# Patient Record
Sex: Male | Born: 1980 | Hispanic: No | Marital: Single | Smoking: Never smoker
Health system: Southern US, Community
[De-identification: ages and names within clinical notes are randomized; demographics above are authoritative.]

---

## 2008-10-12 ENCOUNTER — Emergency Department (HOSPITAL_COMMUNITY): Admission: EM | Admit: 2008-10-12 | Discharge: 2008-10-12 | Payer: Self-pay | Admitting: Emergency Medicine

## 2011-01-14 LAB — CBC
HCT: 47.9 % (ref 39.0–52.0)
Hemoglobin: 16.2 g/dL (ref 13.0–17.0)
MCHC: 33.9 g/dL (ref 30.0–36.0)
MCV: 92.7 fL (ref 78.0–100.0)
RBC: 5.16 MIL/uL (ref 4.22–5.81)
RDW: 13.4 % (ref 11.5–15.5)

## 2011-01-14 LAB — POCT I-STAT, CHEM 8
Calcium, Ion: 1.08 mmol/L — ABNORMAL LOW (ref 1.12–1.32)
Creatinine, Ser: 0.8 mg/dL (ref 0.4–1.5)
Glucose, Bld: 109 mg/dL — ABNORMAL HIGH (ref 70–99)
HCT: 51 % (ref 39.0–52.0)
Hemoglobin: 17.3 g/dL — ABNORMAL HIGH (ref 13.0–17.0)
Potassium: 3.3 mEq/L — ABNORMAL LOW (ref 3.5–5.1)
TCO2: 24 mmol/L (ref 0–100)

## 2011-01-14 LAB — DIFFERENTIAL
Basophils Absolute: 0.1 10*3/uL (ref 0.0–0.1)
Basophils Relative: 1 % (ref 0–1)
Eosinophils Absolute: 0 10*3/uL (ref 0.0–0.7)
Eosinophils Relative: 1 % (ref 0–5)
Monocytes Absolute: 0.8 10*3/uL (ref 0.1–1.0)
Monocytes Relative: 12 % (ref 3–12)
Neutro Abs: 4.5 10*3/uL (ref 1.7–7.7)

## 2011-01-14 LAB — POCT CARDIAC MARKERS: CKMB, poc: <1 ng/mL — ABNORMAL LOW (ref 1.0–8.0)

## 2016-03-02 ENCOUNTER — Emergency Department
Admission: EM | Admit: 2016-03-02 | Discharge: 2016-03-02 | Disposition: A | Discharge status: Home / Self Care | Payer: Self-pay | Attending: Emergency Medicine | Admitting: Emergency Medicine

## 2016-03-02 ENCOUNTER — Encounter: Payer: Self-pay | Admitting: Emergency Medicine

## 2016-03-02 DIAGNOSIS — K4090 Unilateral inguinal hernia, without obstruction or gangrene, not specified as recurrent: Secondary | ICD-10-CM | POA: Insufficient documentation

## 2016-03-02 DIAGNOSIS — F17210 Nicotine dependence, cigarettes, uncomplicated: Secondary | ICD-10-CM | POA: Insufficient documentation

## 2016-03-02 NOTE — ED Provider Notes (Addendum)
[facility] Emergency Department Provider Note  ____________________________________________   I have reviewed the triage vital signs and the nursing notes.   HISTORY  Chief Complaint Inguinal Hernia    HPI Robert Kaiser is a 35 y.o. male states she has had an inguinal hernia for 10 years. He states that he just can't take it anymore.  He denies any particular change in his chief complaint. This is a hasn't changed recently in any way that is significant. Doesn't become more painful has become more swollen has not had any significant change in color. Sometimes it abrades against his trousers and becomes a little reddened but that is normal and usually when he stops wearing pants for a little while it resolves. He has not had any trouble with his bowel movements he is not vomiting he has no abdominal pain. He states it is painful to carry hernia around that there is been no significant change in the pain of the hernia itself.      History reviewed. No pertinent past medical history.  There are no active problems to display for this patient.   History reviewed. No pertinent past surgical history.  Current Outpatient Rx  Name  Route  Sig  Dispense  Refill  . ranitidine (ZANTAC) 150 MG tablet   Oral   Take 150 mg by mouth 2 (two) times daily.           Allergies Review of patient's allergies indicates no known allergies.  No family history on file.  Social History Social History  Substance Use Topics  . Smoking status: Current Some Day Smoker    Types: Cigarettes  . Smokeless tobacco: None  . Alcohol Use: Yes     Comment: on the weekends    Review of Systems Constitutional: No fever/chills Eyes: No visual changes. ENT: No sore throat. No stiff neck no neck pain Cardiovascular: Denies chest pain. Respiratory: Denies shortness of breath. Gastrointestinal:   no vomiting.  No diarrhea.  No constipation. Genitourinary: Negative for  dysuria. Musculoskeletal: Negative lower extremity swelling Skin: Negative for rash. Neurological: Negative for headaches, focal weakness or numbness. 10-point ROS otherwise negative.  ____________________________________________   PHYSICAL EXAM:  VITAL SIGNS: ED Triage Vitals  Enc Vitals Group     BP 03/02/16 1803 140/91 mmHg     Pulse Rate 03/02/16 1803 94     Resp 03/02/16 1803 95     Temp 03/02/16 1803 99.1 F (37.3 C)     Temp Source 03/02/16 1803 Oral     SpO2 03/02/16 1803 95 %     Weight 03/02/16 1803 185 lb (83.915 kg)     Height 03/02/16 1803  (1.702 m)     Head Cir --      Peak Flow --      Pain Score 03/02/16 1806 10     Pain Loc --      Pain Edu? --      Excl. in GC? --     Constitutional: Alert and oriented. Well appearing and in no acute distress. Eyes: Conjunctivae are normal. PERRL. EOMI. Head: Atraumatic. Nose: No congestion/rhinnorhea. Mouth/Throat: Mucous membranes are moist.  Oropharynx non-erythematous. Neck: No stridor.   Nontender with no meningismus Cardiovascular: Normal rate, regular rhythm. Grossly normal heart sounds.  Good peripheral circulation. Respiratory: Normal respiratory effort.  No retractions. Lungs CTAB. Abdominal: Soft and nontender. No distention. No guarding no rebound Back:  There is no focal tenderness or step off there is no  midline tenderness there are no lesions noted. there is no CVA tenderness There is a very very large inguinal hernia with good bowel sounds noted soft, not reducible given its size and chronicity however it is not indurated or erythematous and only minimally tender Musculoskeletal: No lower extremity tenderness. No joint effusions, no DVT signs strong distal pulses no edema Neurologic:  Normal speech and language. No gross focal neurologic deficits are appreciated.  Skin:  Skin is warm, dry and intact. No rash noted. Psychiatric: Mood and affect are normal. Speech and behavior are  normal.  ____________________________________________   LABS (all labs ordered are listed, but only abnormal results are displayed)  Labs Reviewed - No data to display ____________________________________________  EKG  I personally interpreted any EKGs ordered by me or triage  ____________________________________________  RADIOLOGY  I reviewed any imaging ordered by me or triage that were performed during my shift and, if possible, patient and/or family made aware of any abnormal findings. ____________________________________________   PROCEDURES  Procedure(s) performed: None  Critical Care performed: None  ____________________________________________   INITIAL IMPRESSION / ASSESSMENT AND PLAN / ED COURSE  Pertinent labs & imaging results that were available during my care of the patient were reviewed by me and considered in my medical decision making (see chart for details).  Patient with a large inguinal hernia. It is too large to reduce and there is not much point in doing so given how long he has been out and how large it is. There is no evidence of any strangulated incarceration or ischemia. No signs are reassuring. It is not hot to touch, is not red. Old changes appear quite chronic. Extensive return precautions given and understood. The discussed with surgery, Dr. Everlene FarrierPabon, who asked that we refer the patient as an outpatient to them.  ----------------------------------------- 7:51 PM on 03/02/2016 -----------------------------------------  Serial exams show no evidence of acute strangulation, patient has been having normal bowel movements and is in no acute distress. He will follow closely with surgery as an outpatient. Extensive return precautions given and understood.   FINAL CLINICAL IMPRESSION(S) / ED DIAGNOSES  Final diagnoses:  None      This chart was dictated using voice recognition software.  Despite best efforts to proofread,  errors can occur which  can change meaning.     Jeanmarie PlantJames A McShane, MD 03/02/16 1923  Jeanmarie PlantJames A McShane, MD 03/02/16 [phone]

## 2016-03-02 NOTE — ED Notes (Signed)
Patient presents to the ED with issues with his hernia.  Patient states he has had it for several years.  Patient states his hernia is in his groin.  Patient states area is getting larger and becoming more painful over the past year.  Patient states pain is radiating into his lower back and causing difficulty walking and working.  Patient states area is reddish.

## 2016-03-02 NOTE — Discharge Instructions (Signed)

## 2016-03-08 ENCOUNTER — Encounter: Payer: Self-pay | Admitting: Emergency Medicine

## 2016-03-08 ENCOUNTER — Inpatient Hospital Stay
Admission: EM | Admit: 2016-03-08 | Discharge: 2016-03-11 | DRG: 352 | Disposition: A | Discharge status: Home / Self Care | Payer: Self-pay | Attending: Surgery | Admitting: Surgery

## 2016-03-08 ENCOUNTER — Emergency Department: Payer: Self-pay

## 2016-03-08 DIAGNOSIS — F17210 Nicotine dependence, cigarettes, uncomplicated: Secondary | ICD-10-CM | POA: Diagnosis present

## 2016-03-08 DIAGNOSIS — K219 Gastro-esophageal reflux disease without esophagitis: Secondary | ICD-10-CM | POA: Diagnosis present

## 2016-03-08 DIAGNOSIS — K4030 Unilateral inguinal hernia, with obstruction, without gangrene, not specified as recurrent: Principal | ICD-10-CM

## 2016-03-08 DIAGNOSIS — K4090 Unilateral inguinal hernia, without obstruction or gangrene, not specified as recurrent: Secondary | ICD-10-CM

## 2016-03-08 LAB — COMPREHENSIVE METABOLIC PANEL
ALT: 288 U/L — ABNORMAL HIGH (ref 17–63)
AST: 180 U/L — ABNORMAL HIGH (ref 15–41)
Albumin: 3.9 g/dL (ref 3.5–5.0)
Alkaline Phosphatase: 84 U/L (ref 38–126)
Anion gap: 12 (ref 5–15)
BILIRUBIN TOTAL: 0.7 mg/dL (ref 0.3–1.2)
BUN: 13 mg/dL (ref 6–20)
CHLORIDE: 105 mmol/L (ref 101–111)
CO2: 20 mmol/L — ABNORMAL LOW (ref 22–32)
CREATININE: 0.75 mg/dL (ref 0.61–1.24)
Calcium: 8.5 mg/dL — ABNORMAL LOW (ref 8.9–10.3)
Glucose, Bld: 148 mg/dL — ABNORMAL HIGH (ref 65–99)
POTASSIUM: 3.2 mmol/L — AB (ref 3.5–5.1)
Sodium: 137 mmol/L (ref 135–145)
TOTAL PROTEIN: 7.6 g/dL (ref 6.5–8.1)

## 2016-03-08 LAB — URINALYSIS COMPLETE WITH MICROSCOPIC (ARMC ONLY)
BILIRUBIN URINE: NEGATIVE
Bacteria, UA: NONE SEEN
Glucose, UA: NEGATIVE mg/dL
HGB URINE DIPSTICK: NEGATIVE
LEUKOCYTES UA: NEGATIVE
NITRITE: NEGATIVE
PH: 7 (ref 5.0–8.0)
PROTEIN: NEGATIVE mg/dL
RBC / HPF: NONE SEEN RBC/hpf (ref 0–5)
SPECIFIC GRAVITY, URINE: 1.019 (ref 1.005–1.030)
SQUAMOUS EPITHELIAL / LPF: NONE SEEN
WBC, UA: NONE SEEN WBC/hpf (ref 0–5)

## 2016-03-08 LAB — CBC WITH DIFFERENTIAL/PLATELET
BASOS ABS: 0 10*3/uL (ref 0–0.1)
BASOS PCT: 1 %
EOS PCT: 2 %
Eosinophils Absolute: 0.1 10*3/uL (ref 0–0.7)
HCT: 44.6 % (ref 40.0–52.0)
Hemoglobin: 15.4 g/dL (ref 13.0–18.0)
LYMPHS PCT: 21 %
Lymphs Abs: 1.6 10*3/uL (ref 1.0–3.6)
MCH: 31.6 pg (ref 26.0–34.0)
MCHC: 34.5 g/dL (ref 32.0–36.0)
MCV: 91.6 fL (ref 80.0–100.0)
MONO ABS: 0.8 10*3/uL (ref 0.2–1.0)
Monocytes Relative: 11 %
NEUTROS ABS: 4.9 10*3/uL (ref 1.4–6.5)
Neutrophils Relative %: 65 %
PLATELETS: 147 10*3/uL — AB (ref 150–440)
RBC: 4.87 MIL/uL (ref 4.40–5.90)
RDW: 13.9 % (ref 11.5–14.5)
WBC: 7.4 10*3/uL (ref 3.8–10.6)

## 2016-03-08 LAB — LIPASE, BLOOD: LIPASE: 29 U/L (ref 11–51)

## 2016-03-08 MED ORDER — CEFAZOLIN SODIUM 1-5 GM-% IV SOLN
1.0000 g | INTRAVENOUS | Status: AC
  Administered 2016-03-09: 1 g via INTRAVENOUS
  Filled 2016-03-08: qty 50

## 2016-03-08 MED ORDER — HYDROCODONE-ACETAMINOPHEN 5-325 MG PO TABS
1.0000 | ORAL_TABLET | ORAL | Status: DC | PRN
  Administered 2016-03-09 – 2016-03-11 (×4): 1 via ORAL
  Filled 2016-03-08 (×4): qty 1

## 2016-03-08 MED ORDER — PANTOPRAZOLE SODIUM 40 MG PO TBEC
40.0000 mg | DELAYED_RELEASE_TABLET | Freq: Every day | ORAL | Status: DC
  Administered 2016-03-10 – 2016-03-11 (×2): 40 mg via ORAL
  Filled 2016-03-08 (×2): qty 1

## 2016-03-08 MED ORDER — ALVIMOPAN 12 MG PO CAPS: 12.0000 mg | ORAL_CAPSULE | Freq: Once | ORAL | Status: DC

## 2016-03-08 MED ORDER — ONDANSETRON HCL 4 MG/2ML IJ SOLN: 4.0000 mg | Freq: Four times a day (QID) | INTRAMUSCULAR | Status: DC | PRN

## 2016-03-08 MED ORDER — ACETAMINOPHEN 325 MG PO TABS: 650.0000 mg | ORAL_TABLET | Freq: Four times a day (QID) | ORAL | Status: DC | PRN

## 2016-03-08 MED ORDER — ENOXAPARIN SODIUM 40 MG/0.4ML ~~LOC~~ SOLN
40.0000 mg | SUBCUTANEOUS | Status: DC
  Administered 2016-03-09 – 2016-03-10 (×2): 40 mg via SUBCUTANEOUS
  Filled 2016-03-08 (×2): qty 0.4

## 2016-03-08 MED ORDER — KCL IN DEXTROSE-NACL 20-5-0.45 MEQ/L-%-% IV SOLN
INTRAVENOUS | Status: DC
  Administered 2016-03-08 – 2016-03-10 (×3): via INTRAVENOUS
  Filled 2016-03-08 (×5): qty 1000

## 2016-03-08 MED ORDER — MORPHINE SULFATE (PF) 4 MG/ML IV SOLN
4.0000 mg | Freq: Once | INTRAVENOUS | Status: AC
  Administered 2016-03-08: 4 mg via INTRAVENOUS
  Filled 2016-03-08: qty 1

## 2016-03-08 MED ORDER — HYDROMORPHONE HCL 1 MG/ML IJ SOLN: 1.0000 mg | INTRAMUSCULAR | Status: DC | PRN

## 2016-03-08 MED ORDER — DIATRIZOATE MEGLUMINE & SODIUM 66-10 % PO SOLN
15.0000 mL | Freq: Once | ORAL | Status: AC
  Administered 2016-03-08: 15 mL via ORAL

## 2016-03-08 MED ORDER — SODIUM CHLORIDE 0.9 % IV BOLUS (SEPSIS)
1000.0000 mL | Freq: Once | INTRAVENOUS | Status: AC
  Administered 2016-03-08: 1000 mL via INTRAVENOUS

## 2016-03-08 MED ORDER — ACETAMINOPHEN 650 MG RE SUPP: 650.0000 mg | Freq: Four times a day (QID) | RECTAL | Status: DC | PRN

## 2016-03-08 MED ORDER — IOPAMIDOL (ISOVUE-300) INJECTION 61%
100.0000 mL | Freq: Once | INTRAVENOUS | Status: AC | PRN
  Administered 2016-03-08: 100 mL via INTRAVENOUS

## 2016-03-08 MED ORDER — CEFAZOLIN (ANCEF) 1 G IV SOLR: 1.0000 g | INTRAVENOUS | Status: DC

## 2016-03-08 MED ORDER — ONDANSETRON 8 MG PO TBDP: 4.0000 mg | ORAL_TABLET | Freq: Four times a day (QID) | ORAL | Status: DC | PRN

## 2016-03-08 NOTE — ED Notes (Signed)
Patient was seen in the ED recently for same complaint.  Returns today with c/o pain to testicular and thorax pain.  Patient states he has an issue with a testicular hernia and states that pain has worsened and has difficulty walking.  Patient states he has a follow up appointment scheduled for Wednesday.

## 2016-03-08 NOTE — H&P (Signed)
Robert Kaiser is a 35 y.o. male  with a long-standing increasingly symptomatic left inguinal hernia.  HPI: He was seen a week ago with symptomatic left inguinal hernia which was incarcerated. Workup revealed a large portion of his colon in the left scrotum. He did not have any nausea or vomiting at that time and relates normal bowel function. He did present for further evaluation and was referred to the surgical outpatient clinic. However he could not make an appointment in a timely fashion and continues to have increasing symptoms. His pain has worsened over the last 36 hours. He presents back to the emergency room for further evaluation.  He denies any other significant abdominal problem. He has no previous abdominal surgery. Specifically denies any history of hepatitis, yellow jaundice, pancreatitis, peptic ulcer disease, gallbladder disease, or diverticulitis. He does have a history of esophageal reflux.  History reviewed. No pertinent past medical history. History reviewed. No pertinent past surgical history. Social History   Social History  . Marital Status: Single    Spouse Name: N/A  . Number of Children: N/A  . Years of Education: N/A   Social History Main Topics  . Smoking status: Current Some Day Smoker    Types: Cigarettes  . Smokeless tobacco: None  . Alcohol Use: Yes     Comment: on the weekends  . Drug Use: None  . Sexual Activity: Not Asked   Other Topics Concern  . None   Social History Narrative     Review of Systems  Constitutional: Negative for fever and chills.  HENT: Negative.   Eyes: Negative.  Negative for blurred vision.  Respiratory: Negative for cough, shortness of breath and wheezing.   Cardiovascular: Negative for chest pain and palpitations.  Gastrointestinal: Positive for heartburn and nausea. Negative for vomiting and abdominal pain.  Genitourinary: Negative.   Musculoskeletal: Negative.   Skin: Negative.   Neurological: Negative.  Negative for  headaches.  Psychiatric/Behavioral: Negative.      PHYSICAL EXAM: BP 120/70 mmHg  Pulse 106  Temp(Src) 97.7 F (36.5 C) (Oral)  Resp 18  Ht 5\' 7"  (1.702 m)  Wt 86.183 kg (190 lb)  BMI 29.75 kg/m2  SpO2 90%  Physical Exam  Constitutional: He is oriented to person, place, and time. He appears well-developed and well-nourished. He appears distressed.  HENT:  Head: Normocephalic and atraumatic.  Eyes: EOM are normal. Pupils are equal, round, and reactive to light.  Neck: Normal range of motion. Neck supple.  Cardiovascular: Normal rate, regular rhythm and normal heart sounds.   Pulmonary/Chest: Effort normal and breath sounds normal. No respiratory distress. He has no wheezes.  Abdominal: Soft. Bowel sounds are normal. He exhibits distension. There is no tenderness.  Musculoskeletal: Normal range of motion. He exhibits no edema or tenderness.  Neurological: He is alert and oriented to person, place, and time.  Skin: Skin is warm and dry.  Psychiatric: He has a normal mood and affect. His behavior is normal.   Abdomen is soft without any significant tenderness. He is mildly distended. He has a massive left inguinal hernia which is not reducible. The scrotum is essentially the size of a basketball. There is no evidence of any edema or discoloration.  Impression/Plan: We discussed the options available to him in detail. Because of his increasing symptoms we will planned omitting observation and elected to perform surgery later today or tomorrow morning. Risks benefits and options have been outlined and accepted. He is in agreement.I have explained the procedure, risks,  and aftercare of inguinal hernia repair to [employer].   Risks include but are not limited to bleeding, infection, wound problems, anesthesia, recurrence, bladder or intestine injury, urinary retention, testicular dysfunction, chronic pain, mesh problems.  He  seems to understand and agrees to proceed.  Questions were  answered to his stated satisfaction.   Robert Rouge III, MD  03/08/2016, 12:15 PM

## 2016-03-08 NOTE — ED Provider Notes (Addendum)
----------------------------------------- 10:20 AM on 03/08/2016 -----------------------------------------  [facility] Emergency Department Provider Note  ____________________________________________   I have reviewed the triage vital signs and the nursing notes.   HISTORY  Chief Complaint No chief complaint on file.    HPI Robert Kaiser is a 35 y.o. male with a chronic scrotal hernia seen here 4 days ago for similar. Was to follow-up with surgery. Patient has been unable to get in with surgery, and he states that his pain is getting severe. He denies any fever or chills. Denies nausea or vomiting. Did have a normal bowel movement yesterday. States that he cannot walk or function because he isn't too much pain from his hernia. He was started on pain medication when he came in.He states the pain goes "everywhere" but mostly it's in his scrotal region. He is unable to ambulate or work. He does have an appointment for next Wednesday to see surgery but cannot make it that long because of the increasing pain    History reviewed. No pertinent past medical history.  There are no active problems to display for this patient.   History reviewed. No pertinent past surgical history.  Current Outpatient Rx  Name  Route  Sig  Dispense  Refill  . ranitidine (ZANTAC) 150 MG tablet   Oral   Take 150 mg by mouth 2 (two) times daily.           Allergies Review of patient's allergies indicates no known allergies.  No family history on file.  Social History Social History  Substance Use Topics  . Smoking status: Current Some Day Smoker    Types: Cigarettes  . Smokeless tobacco: None  . Alcohol Use: Yes     Comment: on the weekends    Review of Systems Constitutional: No fever/chills Eyes: No visual changes. ENT: No sore throat. No stiff neck no neck pain Cardiovascular: Denies chest pain. Respiratory: Denies shortness of breath. Gastrointestinal:   no  vomiting.  No diarrhea.  No constipation. Genitourinary: Negative for dysuria. Musculoskeletal: Negative lower extremity swelling Skin: Negative for rash. Neurological: Negative for headaches, focal weakness or numbness. 10-point ROS otherwise negative.  ____________________________________________   PHYSICAL EXAM:  VITAL SIGNS: ED Triage Vitals  Enc Vitals Group     BP 03/08/16 0803 129/96 mmHg     Pulse Rate 03/08/16 0803 95     Resp 03/08/16 0803 18     Temp 03/08/16 0803 97.7 F (36.5 C)     Temp Source 03/08/16 0803 Oral     SpO2 03/08/16 0803 94 %     Weight 03/08/16 0810 190 lb (86.183 kg)     Height 03/08/16 0810 5\' 7"  (1.702 m)     Head Cir --      Peak Flow --      Pain Score 03/08/16 0808 10     Pain Loc --      Pain Edu? --      Excl. in GC? --     Constitutional: Alert and oriented. Well appearing and in no acute distress.Anxious and upset Eyes: Conjunctivae are normal. PERRL. EOMI. Head: Atraumatic. Nose: No congestion/rhinnorhea. Mouth/Throat: Mucous membranes are moist.  Oropharynx non-erythematous. Neck: No stridor.   Nontender with no meningismus Cardiovascular: Normal rate, regular rhythm. Grossly normal heart sounds.  Good peripheral circulation. Respiratory: Normal respiratory effort.  No retractions. Lungs CTAB. Abdominal: Soft and nontender. No distention. No guarding no rebound Back:  There is no focal tenderness or step off there  is no midline tenderness there are no lesions noted. there is no CVA tenderness Again noted is a very large scrotal hernia with good bowel sounds no evidence of Fournier's or cellulitis, and tender but not acutely so. Musculoskeletal: No lower extremity tenderness. No joint effusions, no DVT signs strong distal pulses no edema Neurologic:  Normal speech and language. No gross focal neurologic deficits are appreciated.  Skin:  Skin is warm, dry and intact. No rash noted. Psychiatric: Mood and affect are normal. Speech and  behavior are normal.  ____________________________________________   LABS (all labs ordered are listed, but only abnormal results are displayed)  Labs Reviewed  COMPREHENSIVE METABOLIC PANEL - Abnormal; Notable for the following:    Potassium 3.2 (*)    CO2 20 (*)    Glucose, Bld 148 (*)    Calcium 8.5 (*)    AST 180 (*)    ALT 288 (*)    All other components within normal limits  CBC WITH DIFFERENTIAL/PLATELET - Abnormal; Notable for the following:    Platelets 147 (*)    All other components within normal limits  URINALYSIS COMPLETEWITH MICROSCOPIC ([facility] ONLY) - Abnormal; Notable for the following:    Color, Urine STRAW (*)    APPearance CLEAR (*)    Ketones, ur TRACE (*)    All other components within normal limits  LIPASE, BLOOD   ____________________________________________  EKG  I personally interpreted any EKGs ordered by me or triage  ____________________________________________  RADIOLOGY  I reviewed any imaging ordered by me or triage that were performed during my shift and, if possible, patient and/or family made aware of any abnormal findings. ____________________________________________   PROCEDURES  Procedure(s) performed: None  Critical Care performed: None  ____________________________________________   INITIAL IMPRESSION / ASSESSMENT AND PLAN / ED COURSE  Pertinent labs & imaging results that were available during my care of the patient were reviewed by me and considered in my medical decision making (see chart for details).  Patient with no evidence of strangulation, he does have a very significant inguinal herniation. Blood work is reassuring I did do a CT scan this time to ensure that there was no other pathology, and it shows that his entire colon and part of his terminal ileum is in his scrotal sac. No evidence of other inflammatory changes. I have paged surgery, given the patient is failing outpatient management and we will see what their  input will be.  ----------------------------------------- 10:55 AM on 03/08/2016 -----------------------------------------  Dr. Michela Pitcher has graciously taken the consult and agrees to come see the patient assumes he is done with surgery. Patient resting comfortably at this time with ongoing but diminished discomfort. FINAL CLINICAL IMPRESSION(S) / ED DIAGNOSES  Final diagnoses:  None      This chart was dictated using voice recognition software.  Despite best efforts to proofread,  errors can occur which can change meaning.     Jeanmarie Plant, MD 03/08/16 1023  Jeanmarie Plant, MD 03/08/16 1056

## 2016-03-08 NOTE — ED Notes (Signed)
Patient states that he is having pain in his lower back, lower abdomen, and the pain radiates down into his testicles. Patient states that the pain is so that is so bad that he can hardly walk.

## 2016-03-08 NOTE — ED Notes (Signed)
Per surgeon all orders released so that bed assignment people could assign bed

## 2016-03-09 ENCOUNTER — Encounter
Admission: EM | Disposition: A | Discharge status: Home / Self Care | Payer: Self-pay | Source: Home / Self Care | Attending: Surgery

## 2016-03-09 ENCOUNTER — Observation Stay: Payer: Self-pay | Admitting: Certified Registered Nurse Anesthetist

## 2016-03-09 ENCOUNTER — Encounter: Payer: Self-pay | Admitting: Anesthesiology

## 2016-03-09 HISTORY — PX: OTHER SURGICAL HISTORY: SHX169

## 2016-03-09 HISTORY — PX: INGUINAL HERNIA REPAIR: SHX194

## 2016-03-09 LAB — BASIC METABOLIC PANEL
Anion gap: 5 (ref 5–15)
BUN: 12 mg/dL (ref 6–20)
CHLORIDE: 102 mmol/L (ref 101–111)
CO2: 27 mmol/L (ref 22–32)
CREATININE: 0.86 mg/dL (ref 0.61–1.24)
Calcium: 8.4 mg/dL — ABNORMAL LOW (ref 8.9–10.3)
GFR calc non Af Amer: >60 mL/min (ref >60)
GLUCOSE: 100 mg/dL — AB (ref 65–99)
Potassium: 4 mmol/L (ref 3.5–5.1)
Sodium: 134 mmol/L — ABNORMAL LOW (ref 135–145)

## 2016-03-09 LAB — CBC
HCT: 47.8 % (ref 40.0–52.0)
HEMOGLOBIN: 16.3 g/dL (ref 13.0–18.0)
MCH: 32.2 pg (ref 26.0–34.0)
MCHC: 34.1 g/dL (ref 32.0–36.0)
MCV: 94.2 fL (ref 80.0–100.0)
PLATELETS: 136 10*3/uL — AB (ref 150–440)
RBC: 5.08 MIL/uL (ref 4.40–5.90)
RDW: 14.1 % (ref 11.5–14.5)
WBC: 6.1 10*3/uL (ref 3.8–10.6)

## 2016-03-09 LAB — SURGICAL PCR SCREEN
MRSA, PCR: NEGATIVE
Staphylococcus aureus: NEGATIVE

## 2016-03-09 SURGERY — REPAIR, HERNIA, INGUINAL, ADULT
Anesthesia: General | Laterality: Left

## 2016-03-09 MED ORDER — ACETAMINOPHEN 650 MG RE SUPP: 650.0000 mg | Freq: Four times a day (QID) | RECTAL | Status: DC | PRN

## 2016-03-09 MED ORDER — FENTANYL CITRATE (PF) 100 MCG/2ML IJ SOLN
INTRAMUSCULAR | Status: AC
  Filled 2016-03-09: qty 2

## 2016-03-09 MED ORDER — PROPOFOL 10 MG/ML IV BOLUS
INTRAVENOUS | Status: DC | PRN
  Administered 2016-03-09: 200 mg via INTRAVENOUS

## 2016-03-09 MED ORDER — MIDAZOLAM HCL 2 MG/2ML IJ SOLN
INTRAMUSCULAR | Status: DC | PRN
  Administered 2016-03-09: 2 mg via INTRAVENOUS
  Administered 2016-03-09: 3 mg via INTRAVENOUS

## 2016-03-09 MED ORDER — ONDANSETRON HCL 4 MG/2ML IJ SOLN: 4.0000 mg | Freq: Once | INTRAMUSCULAR | Status: DC | PRN

## 2016-03-09 MED ORDER — ACETAMINOPHEN 325 MG PO TABS: 650.0000 mg | ORAL_TABLET | Freq: Four times a day (QID) | ORAL | Status: DC | PRN

## 2016-03-09 MED ORDER — LACTATED RINGERS IV SOLN
INTRAVENOUS | Status: DC | PRN
  Administered 2016-03-09 (×2): via INTRAVENOUS

## 2016-03-09 MED ORDER — ONDANSETRON HCL 4 MG/2ML IJ SOLN
INTRAMUSCULAR | Status: DC | PRN
  Administered 2016-03-09: 4 mg via INTRAVENOUS

## 2016-03-09 MED ORDER — ROCURONIUM BROMIDE 100 MG/10ML IV SOLN
INTRAVENOUS | Status: DC | PRN
  Administered 2016-03-09: 5 mg via INTRAVENOUS

## 2016-03-09 MED ORDER — FENTANYL CITRATE (PF) 100 MCG/2ML IJ SOLN
25.0000 ug | INTRAMUSCULAR | Status: DC | PRN
  Administered 2016-03-09 (×4): 25 ug via INTRAVENOUS

## 2016-03-09 MED ORDER — FENTANYL CITRATE (PF) 100 MCG/2ML IJ SOLN
INTRAMUSCULAR | Status: DC | PRN
  Administered 2016-03-09: 150 ug via INTRAVENOUS
  Administered 2016-03-09: 100 ug via INTRAVENOUS
  Administered 2016-03-09: 50 ug via INTRAVENOUS

## 2016-03-09 MED ORDER — NEOMYCIN-POLYMYXIN B GU 40-200000 IR SOLN
Status: AC
  Filled 2016-03-09: qty 2

## 2016-03-09 MED ORDER — LIDOCAINE HCL (CARDIAC) 20 MG/ML IV SOLN
INTRAVENOUS | Status: DC | PRN
  Administered 2016-03-09: 100 mg via INTRAVENOUS

## 2016-03-09 MED ORDER — DEXAMETHASONE SODIUM PHOSPHATE 10 MG/ML IJ SOLN
INTRAMUSCULAR | Status: DC | PRN
  Administered 2016-03-09: 8 mg via INTRAVENOUS

## 2016-03-09 MED ORDER — SUCCINYLCHOLINE CHLORIDE 20 MG/ML IJ SOLN
INTRAMUSCULAR | Status: DC | PRN
  Administered 2016-03-09: 100 mg via INTRAVENOUS

## 2016-03-09 MED ORDER — BUPIVACAINE HCL (PF) 0.25 % IJ SOLN
INTRAMUSCULAR | Status: AC
  Filled 2016-03-09: qty 30

## 2016-03-09 SURGICAL SUPPLY — 44 items
BULB RESERV EVAC DRAIN JP 100C (MISCELLANEOUS) ×4 IMPLANT
CANISTER SUCT 1200ML W/VALVE (MISCELLANEOUS) ×2 IMPLANT
CHLORAPREP W/TINT 26ML (MISCELLANEOUS) ×2 IMPLANT
DRAIN CHANNEL JP 19F (MISCELLANEOUS) ×4 IMPLANT
DRAIN PENROSE 5/8X18 LTX STRL (WOUND CARE) ×2 IMPLANT
DRAPE INCISE IOBAN 66X45 STRL (DRAPES) ×2 IMPLANT
DRAPE LAPAROTOMY 100X77 ABD (DRAPES) ×2 IMPLANT
DRAPE SHEET LG 3/4 BI-LAMINATE (DRAPES) ×2 IMPLANT
DRAPE UTILITY 15X26 TOWEL STRL (DRAPES) IMPLANT
DRSG OPSITE POSTOP 4X12 (GAUZE/BANDAGES/DRESSINGS) ×2 IMPLANT
ELECT CAUTERY BLADE 6.4 (BLADE) ×2 IMPLANT
ELECT REM PT RETURN 9FT ADLT (ELECTROSURGICAL) ×2
ELECTRODE REM PT RTRN 9FT ADLT (ELECTROSURGICAL) ×1 IMPLANT
GAUZE SPONGE 4X4 12PLY STRL (GAUZE/BANDAGES/DRESSINGS) ×2 IMPLANT
GLOVE BIO SURGEON STRL SZ7.5 (GLOVE) ×8 IMPLANT
GLOVE INDICATOR 8.0 STRL GRN (GLOVE) ×2 IMPLANT
GOWN STRL REUS W/ TWL LRG LVL3 (GOWN DISPOSABLE) ×2 IMPLANT
GOWN STRL REUS W/TWL LRG LVL3 (GOWN DISPOSABLE) ×2
KIT RM TURNOVER STRD PROC AR (KITS) ×2 IMPLANT
LABEL OR SOLS (LABEL) ×2 IMPLANT
LIGASURE BLUNT 5MM 37CM (INSTRUMENTS) ×2 IMPLANT
LIQUID BAND (GAUZE/BANDAGES/DRESSINGS) ×2 IMPLANT
MESH HERNIA 1.6X1.9 PLUG LRG (Mesh General) ×1 IMPLANT
MESH HERNIA PLUG LRG (Mesh General) ×1 IMPLANT
NDL SAFETY 25GX1.5 (NEEDLE) ×2 IMPLANT
NEEDLE FILTER BLUNT 18X 1/2SAF (NEEDLE) ×1
NEEDLE FILTER BLUNT 18X1 1/2 (NEEDLE) ×1 IMPLANT
NS IRRIG 500ML POUR BTL (IV SOLUTION) ×2 IMPLANT
PACK BASIN MINOR ARMC (MISCELLANEOUS) ×2 IMPLANT
SPONGE LAP 18X18 5 PK (GAUZE/BANDAGES/DRESSINGS) ×4 IMPLANT
STAPLER SKIN PROX 35W (STAPLE) ×2 IMPLANT
STRIP CLOSURE SKIN 1/2X4 (GAUZE/BANDAGES/DRESSINGS) ×2 IMPLANT
SUT ETHILON 3-0 FS-10 30 BLK (SUTURE) ×4
SUT MAXON ABS #0 GS21 30IN (SUTURE) ×2 IMPLANT
SUT SURGILON 0 30 BLK (SUTURE) ×2 IMPLANT
SUT SURGILON 0 BLK (SUTURE) ×2 IMPLANT
SUT VIC AB 0 CT1 36 (SUTURE) ×2 IMPLANT
SUT VIC AB 0 UR5 27 (SUTURE) ×2 IMPLANT
SUT VIC AB 3-0 SH 27 (SUTURE) ×1
SUT VIC AB 3-0 SH 27X BRD (SUTURE) ×1 IMPLANT
SUTURE EHLN 3-0 FS-10 30 BLK (SUTURE) ×2 IMPLANT
SWABSTK COMLB BENZOIN TINCTURE (MISCELLANEOUS) ×2 IMPLANT
SYR 3ML LL SCALE MARK (SYRINGE) IMPLANT
SYRINGE 10CC LL (SYRINGE) ×2 IMPLANT

## 2016-03-09 NOTE — Op Note (Signed)
03/08/2016 - 03/09/2016  1:09 PM  PATIENT:  Robert Kaiser  35 y.o. male  PRE-OPERATIVE DIAGNOSIS:  incarcerated left inguinal hernia  POST-OPERATIVE DIAGNOSIS:  same  PROCEDURE:  Procedure(s): HERNIA REPAIR INGUINAL ADULT (Left)  SURGEON:  Surgeon(s) and Role:    * Tiney Rougealph Ely III, MD - Primary   ASSISTANTS: none   ANESTHESIA:   none  EBL:  Total I/O In: 1905 [I.V.:1905] Out: 250 [Urine:250]   DRAINS: (2) Jackson-Pratt drain(s) with closed bulb suction in the scrotum   LOCAL MEDICATIONS USED:  MARCAINE      DISPOSITION OF SPECIMEN:  PATHOLOGY   DICTATION: .Dragon Dictation with the patient in the supine position after induction appropriate general anesthesia the patient's abdomen and scrotum were prepped with ChloraPrep and draped sterile towels. Left lower quadrant incision was made from the suprapubic area to the iliac crest on the left side and carried down to the septae stitch Bovie cautery. The external oblique fascia was identified and opened through the external ring. The ring was massive with the large hernia sac. I could not reduce hernia sac out of the scrotum with the bowel in place. Preoperative CT demonstrated the entire colon and part of the terminal ileum was in the hernia sac. I opened the hernia sac proximally dividing the portion of it and then was able to lift the bowel into the incision.  I extended the internal ring defect and effort to try to reduce all of the bowel omentum after much manipulation could not reduce the bowel and the omentum. That reason I used the LigaSure apparatus to remove a large portion of the transverse colon omentum sending it for pathology. With that maneuver I was able to reduce the colon and terminal ileum and the abdominal cavity.  Tedious dissection was then required to remove the massive sac from the scrotum separating it from the cord structures and testicle. Sac was amputated and oversewn at the internal ring using 0 Vicryl. Lap pad  was introduced into the abdominal cavity to hold the sac and placed on the remainder of the procedure was contemplated.  I elected to place a plug and patch system with a plug in the main internal ring defect sutured in place to the conjoined tendon and to the suprapubic tubercle using the 0 Surgilon suture. On top of that I then placed the precut Prolene mesh into the defect suturing it to conjoined tendon and Poupart's ligament using 0 Surgilon. I overlapped the cut ends on the pubic tubercle. I then closed some of the redundant muscle soft tissue over the mesh using interrupted figure-of-eight sutures of 0 Maxon. The sponge was removed prior to closing the lateral aspect. The testicle was then sutured into the scrotum to hold it in place and avoid torsion using 0 Vicryl tied on the outside of the scrotum. 219 French drains were then placed one through the scrotum once the lateral abdomen suture placed with 3-0 nylon suture. Hemostasis appeared satisfactory. The skin was then clipped sterile dressing applied. The patient was awakened and returned recovery room having tolerated procedure well. Sponge instrument and needle count were correct 2 in the operating room.  PLAN OF CARE: Admit to inpatient   PATIENT DISPOSITION:  PACU - hemodynamically stable.   Tiney Rougealph Ely III, MD

## 2016-03-09 NOTE — Anesthesia Preprocedure Evaluation (Signed)
Anesthesia Evaluation  Patient identified by MRN, date of birth, ID band Patient awake    Reviewed: Allergy & Precautions, H&P , NPO status , Patient's Chart, lab work & pertinent test results, reviewed documented beta blocker date and time   Airway Mallampati: IV  TM Distance: >3 FB Neck ROM: full    Dental no notable dental hx. (+) Missing, Teeth Intact   Pulmonary neg shortness of breath, neg sleep apnea, neg COPD, neg recent URI, Current Smoker,    Pulmonary exam normal breath sounds clear to auscultation       Cardiovascular Exercise Tolerance: Good negative cardio ROS Normal cardiovascular exam Rhythm:regular Rate:Normal     Neuro/Psych negative neurological ROS  negative psych ROS   GI/Hepatic Neg liver ROS, GERD  ,  Endo/Other  negative endocrine ROS  Renal/GU negative Renal ROS  negative genitourinary   Musculoskeletal   Abdominal   Peds  Hematology negative hematology ROS (+)   Anesthesia Other Findings History reviewed. No pertinent past medical history.   Reproductive/Obstetrics negative OB ROS                             Anesthesia Physical Anesthesia Plan  ASA: II  Anesthesia Plan: General   Post-op Pain Management:    Induction:   Airway Management Planned:   Additional Equipment:   Intra-op Plan:   Post-operative Plan:   Informed Consent: I have reviewed the patients History and Physical, chart, labs and discussed the procedure including the risks, benefits and alternatives for the proposed anesthesia with the patient or authorized representative who has indicated his/her understanding and acceptance.   Dental Advisory Given  Plan Discussed with: Anesthesiologist, CRNA and Surgeon  Anesthesia Plan Comments:         Anesthesia Quick Evaluation

## 2016-03-09 NOTE — Anesthesia Postprocedure Evaluation (Signed)
Anesthesia Post Note  Patient: Robert Kaiser  Procedure(s) Performed: Procedure(s) (LRB): HERNIA REPAIR INGUINAL ADULT (Left)  Patient location during evaluation: PACU Anesthesia Type: General Level of consciousness: awake and alert Pain management: pain level controlled Vital Signs Assessment: post-procedure vital signs reviewed and stable Respiratory status: spontaneous breathing, nonlabored ventilation, respiratory function stable and patient connected to nasal cannula oxygen Cardiovascular status: blood pressure returned to baseline and stable Postop Assessment: no signs of nausea or vomiting Anesthetic complications: no    Last Vitals:  Filed Vitals:   03/09/16 1407 03/09/16 1420  BP:  122/85  Pulse: 80 80  Temp:  36.7 C  Resp: 15 20    Last Pain:  Filed Vitals:   03/09/16 1529  PainSc: 3                  Lenard SimmerAndrew Latrica Clowers

## 2016-03-09 NOTE — Progress Notes (Signed)

## 2016-03-09 NOTE — Transfer of Care (Signed)
Immediate Anesthesia Transfer of Care Note  Patient: Robert Kaiser  Procedure(s) Performed: Procedure(s): HERNIA REPAIR INGUINAL ADULT (Left)  Patient Location: PACU  Anesthesia Type:General  Level of Consciousness: sedated  Airway & Oxygen Therapy: Patient Spontanous Breathing and Patient connected to face mask oxygen  Post-op Assessment: Report given to RN and Post -op Vital signs reviewed and stable  Post vital signs: Reviewed and stable  Last Vitals:  Filed Vitals:   03/09/16 0058 03/09/16 0505  BP: 122/75 110/69  Pulse: 62 67  Temp: 36.6 C 36.8 C  Resp: 18 20    Last Pain:  Filed Vitals:   03/09/16 0505  PainSc: 6          Complications: No apparent anesthesia complications

## 2016-03-09 NOTE — Anesthesia Procedure Notes (Signed)
Procedure Name: Intubation Date/Time: 03/09/2016 10:44 AM Performed by: Clovis FredricksonRISSON, Johney Perotti Pre-anesthesia Checklist: Patient identified, Emergency Drugs available, Suction available, Patient being monitored and Timeout performed Patient Re-evaluated:Patient Re-evaluated prior to inductionOxygen Delivery Method: Circle system utilized Preoxygenation: Pre-oxygenation with 100% oxygen Intubation Type: IV induction Ventilation: Mask ventilation without difficulty Laryngoscope Size: Mac and 4 Grade View: Grade I Tube type: Oral Tube size: 7.5 mm Number of attempts: 1 Airway Equipment and Method: Stylet Placement Confirmation: ETT inserted through vocal cords under direct vision,  positive ETCO2,  CO2 detector and breath sounds checked- equal and bilateral Secured at: 23 cm Tube secured with: Tape Dental Injury: Teeth and Oropharynx as per pre-operative assessment

## 2016-03-10 LAB — BASIC METABOLIC PANEL
ANION GAP: 7 (ref 5–15)
BUN: 10 mg/dL (ref 6–20)
CALCIUM: 8.3 mg/dL — AB (ref 8.9–10.3)
CO2: 23 mmol/L (ref 22–32)
Chloride: 103 mmol/L (ref 101–111)
Creatinine, Ser: 0.63 mg/dL (ref 0.61–1.24)
GLUCOSE: 122 mg/dL — AB (ref 65–99)
Potassium: 3.9 mmol/L (ref 3.5–5.1)
SODIUM: 133 mmol/L — AB (ref 135–145)

## 2016-03-10 LAB — CBC
HCT: 44.5 % (ref 40.0–52.0)
HEMOGLOBIN: 15.6 g/dL (ref 13.0–18.0)
MCH: 31.9 pg (ref 26.0–34.0)
MCHC: 35 g/dL (ref 32.0–36.0)
MCV: 91 fL (ref 80.0–100.0)
Platelets: 163 10*3/uL (ref 150–440)
RBC: 4.89 MIL/uL (ref 4.40–5.90)
RDW: 13.9 % (ref 11.5–14.5)
WBC: 13.4 10*3/uL — ABNORMAL HIGH (ref 3.8–10.6)

## 2016-03-10 NOTE — Plan of Care (Signed)
Problem: Respiratory: Goal: Ability to achieve and maintain a regular respiratory rate will improve Outcome: Progressing Pt doing Incentive Spirometry as ordered     

## 2016-03-10 NOTE — Progress Notes (Signed)
Subjective: He is now postoperative day 1. He does not have any significant scrotal pain. He has no nausea or vomiting and has been eating a regular diet. His pain is under good control. The total drainage from his JP drains is approximately 150 cc of bloody material over the last 24 hours.  Vital signs in last 24 hours: Temp:  [98.1 F (36.7 C)-99.1 F (37.3 C)] 98.1 F (36.7 C) (06/11 0543) Pulse Rate:  [72-102] 72 (06/11 0543) Resp:  [15-22] 16 (06/11 0543) BP: (113-134)/(74-93) 113/74 mmHg (06/11 0543) SpO2:  [91 %-99 %] 98 % (06/11 0543) Last BM Date: 03/08/16  Intake/Output from previous day: 06/10 0701 - 06/11 0700 In: 3524.8 [I.V.:3524.8] Out: 2240 [Urine:2000; Drains:140; Blood:100]  Exam:  His wound looks good. His scrotum is flat with much improvement over preoperative state. He does not have any significant hematoma at the present time.  Lab Results:  CBC  Recent Labs  03/09/16 0425 03/10/16 0558  WBC 6.1 13.4*  HGB 16.3 15.6  HCT 47.8 44.5  PLT 136* 163   CMP     Component Value Date/Time   NA 133* 03/10/2016 0558   K 3.9 03/10/2016 0558   CL 103 03/10/2016 0558   CO2 23 03/10/2016 0558   GLUCOSE 122* 03/10/2016 0558   BUN 10 03/10/2016 0558   CREATININE 0.63 03/10/2016 0558   CALCIUM 8.3* 03/10/2016 0558   PROT 7.6 03/08/2016 0847   ALBUMIN 3.9 03/08/2016 0847   AST 180* 03/08/2016 0847   ALT 288* 03/08/2016 0847   ALKPHOS 84 03/08/2016 0847   BILITOT 0.7 03/08/2016 0847   GFRNONAA >60 03/10/2016 0558   GFRAA >60 03/10/2016 0558   PT/INR No results for input(s): LABPROT, INR in the last 72 hours.  Studies/Results: Ct Abdomen Pelvis W Contrast  03/08/2016  CLINICAL DATA:  Left scrotal pain. Known hernia. Difficulty walking. EXAM: CT ABDOMEN AND PELVIS WITH CONTRAST TECHNIQUE: Multidetector CT imaging of the abdomen and pelvis was performed using the standard protocol following bolus administration of intravenous contrast. CONTRAST:  100mL  ISOVUE-300 IOPAMIDOL (ISOVUE-300) INJECTION 61% COMPARISON:  None. FINDINGS: Lower chest: The lung bases are clear of acute process. No pleural effusion or pulmonary lesions. The heart is normal in size. No pericardial effusion. The distal esophagus and aorta are unremarkable. Hepatobiliary: No focal hepatic lesions or intrahepatic biliary dilatation. The gallbladder is normal. No common bile duct dilatation. Pancreas: No mass, inflammation or ductal dilatation. Spleen: Normal size.  No focal lesions. Adrenals/Urinary Tract: The adrenal glands and kidneys are normal. No ureteral or bladder calculi. The bladder is normal. Stomach/Bowel: The stomach, duodenum and small bowel are unremarkable. No inflammatory changes, mass lesions or obstructive findings. Patient has a large left inguinal hernia which contains the entire colon and terminal ileum. No obstructive findings or evidence of incarceration. Vascular/Lymphatic: The aorta and branch vessels are normal. The major venous structures are patent. No mesenteric or retroperitoneal mass or adenopathy. Other: No ascites or abdominal wall hernia. No pelvic mass or adenopathy. The prostate gland and seminal vesicles are unremarkable. Musculoskeletal: No significant bony findings. IMPRESSION: 1. Large left inguinal hernia containing the entire colon and terminal ileum. No findings for obstruction or incarceration. 2. No acute abdominal/pelvic findings, mass lesions or adenopathy. Electronically Signed   By: Rudie MeyerP.  Gallerani M.D.   On: 03/08/2016 10:02    Assessment/Plan: He is doing well postoperatively. We will advance his activity level switching to oral pain medications and tentatively plan discharge tomorrow.

## 2016-03-11 ENCOUNTER — Encounter: Payer: Self-pay | Admitting: Surgery

## 2016-03-11 MED ORDER — HYDROCODONE-ACETAMINOPHEN 5-325 MG PO TABS: 1.0000 | ORAL_TABLET | ORAL | Status: DC | PRN

## 2016-03-11 NOTE — Progress Notes (Signed)
Patient discharged to home as ordered left JP drain discontinued as ordered site clean and dry, no drainage noted. Patient given instruction on emptying the JP drain and recording the output. Medical Interpreter at the bedside given discharge instructions. Dressing to the groin clean dry and intact. JP drain still in place

## 2016-03-11 NOTE — Discharge Instructions (Signed)
Hernia en los adultos (Hernia, Adult) Una hernia es la protrusin de un rgano o un tejido a travs de un punto dbil en los msculos del abdomen (pared abdominal). La mayora de las veces, las hernias aparecen cerca del ombligo o de la ingle. Hay muchos tipos de hernias. Los ms frecuentes incluyen los siguientes:  Hernia crural. Este tipo de hernia aparece por debajo de la ingle en la regin superior del muslo.  Hernia inguinal. Este tipo de hernia aparece en la ingle o en el escroto.  Hernia umbilical. Este tipo de hernia aparece cerca del ombligo.  Hernia de hiato. Este tipo de hernia produce el desplazamiento de una porcin del estmago hacia el trax.  Hernia incisional. Este tipo de hernia sobresale a travs de una cicatriz de una ciruga abdominal. CAUSAS Este trastorno puede ser causado por:  Levantar peso excesivo.  Toser durante mucho tiempo.  Dificultad para defecar.  Una incisin realizada durante una ciruga abdominal.  Un defecto congnito.  Sobrepeso u obesidad.  Fumar.  Dficit nutricional.  Fibrosis qustica.  Exceso de lquido en el abdomen.  Criptorquidia (testculos retenidos). SNTOMAS Los sntomas de una hernia incluyen lo siguiente:  Un bulto en el abdomen, que es el primer signo de una hernia. El bulto puede volverse ms evidente al estar de pie, hacer esfuerzos o toser. Puede aumentar de tamao con el tiempo si no se lo trata o si no se trata la afeccin que lo causa.  Dolor. Generalmente, las hernias son indoloras, pero pueden volverse dolorosas con el tiempo si se retrasa el tratamiento. El dolor suele ser sordo y puede ser ms intenso al estar de pie o levantar objetos pesados. A veces, una hernia queda constreida en el punto dbil (estrangulada) o atascada (encarcelada) y causa ms sntomas. Estos sntomas pueden incluir los siguientes:  Vmitos.  Nuseas.  Estreimiento.  Irritabilidad. DIAGNSTICO El diagnstico de una hernia  puede realizarse mediante lo siguiente:  Un examen fsico. Durante el examen, el mdico puede pedirle que tosa o haga un movimiento especfico, porque, por lo general, una hernia es ms visible cuando la persona se mueve.  Diagnstico por imgenes. Estos pueden incluir los siguientes:  Radiografas.  Ecografa.  Tomografa computarizada. TRATAMIENTO Es posible que una hernia pequea e indolora no necesite tratamiento. Una hernia grande o dolorosa puede tratarse con ciruga. Para tratar las hernias inguinales, se puede recurrir a una ciruga para evitar el encarcelamiento o la estrangulacin. Las hernias estranguladas siempre se tratan con ciruga, porque la falta de irrigacin sangunea al rgano o al tejido atascados puede causar su muerte. La ciruga para tratar una hernia incluye volver a colocar el bulto en su lugar y reparar la zona dbil del abdomen. INSTRUCCIONES PARA EL CUIDADO EN EL HOGAR  No haga esfuerzos.  No levante ningn objeto que pese ms de 10libras (4,5kg).  Para hacerlo, use los msculos de las piernas, no los de la espalda. Esto ayuda a evitar las sobrecargas.  Cuando tosa, hgalo con suavidad.  Evitar el estreimiento. El estreimiento obliga a realizar esfuerzos de defecacin, los cuales pueden agravar una hernia o causar la rotura de la reparacin. Para evitar el estreimiento, puede hacer lo siguiente:  Consuma una dieta con alto contenido de fibras que incluya gran cantidad de frutas y verduras.  Beba suficiente lquido para mantener la orina clara o de color amarillo plido. Pngase como objetivo beber 6 u 8vasos de agua por da.  Tome un ablandador de heces como se lo haya indicado el   mdico.  Baje de peso, si tiene sobrepeso.  No consuma ningn producto que contenga tabaco, lo que incluye cigarrillos, tabaco de mascar o cigarrillos electrnicos. Si necesita ayuda para dejar de fumar, consulte al mdico.  Concurra a todas las visitas de control como  se lo haya indicado el mdico. Esto es importante. Es posible que el mdico deba controlar su cuadro clnico. SOLICITE ATENCIN MDICA SI:  Tiene enrojecimiento, hinchazn o dolor en la zona afectada.  Sus hbitos intestinales han cambiado. SOLICITE ATENCIN MDICA DE INMEDIATO SI:  Tiene fiebre.  Tiene dolor abdominal que empeora.  Siente nuseas o vomita.  No puede volver a colocar la hernia en su lugar al ejercer sobre esta una presin suave mientras est acostado.  La hernia:  Cambia de forma o de tamao.  Queda atascada fuera del abdomen.  Cambia de color.  Est dura al tacto o le causa dolor a la palpacin.   Esta informacin no tiene como fin reemplazar el consejo del mdico. Asegrese de hacerle al mdico cualquier pregunta que tenga.   Document Released: 09/16/2005 Document Revised: 10/07/2014 Elsevier Interactive Patient Education 2016 Elsevier Inc.  

## 2016-03-11 NOTE — Discharge Summary (Signed)
  Patient ID: Robert Kaiser MRN: [id number] DOB/AGE: [birth year] 33 y.o.  Admit date: 03/08/2016 Discharge date: 03/11/2016   Discharge Diagnoses:  Active Problems:   Incarcerated left inguinal hernia   Incarcerated inguinal hernia, unilateral   Procedures:Open repair Left Inguinal hernia w mesh  Hospital Course: 35 yo  Male admitted w incarcerated left inguinal hernia containing SB and colon. Taking to the OR by DR. Ely for open repair w mesh and 2 JPs. Stayed in the hospital for 2 nights after the procedure and did very well . Art the time of DC he was ambulating, tolerating PO. His VSS, he was awake alert, Abd was soft, incision was healing well, no infection or hematoma. JP w serosanguinous drainage. We will remove Inguinal JP and keep Scotral JP in place. Conditions at the time of DC is stable   Disposition: 01-Home or Self Care  Discharge Instructions    Call MD for:  difficulty breathing, headache or visual disturbances    Complete by:  As directed      Call MD for:  hives    Complete by:  As directed      Call MD for:  persistant dizziness or light-headedness    Complete by:  As directed      Call MD for:  persistant nausea and vomiting    Complete by:  As directed      Call MD for:  redness, tenderness, or signs of infection (pain, swelling, redness, odor or green/yellow discharge around incision site)    Complete by:  As directed      Call MD for:  severe uncontrolled pain    Complete by:  As directed      Call MD for:  temperature >100.4    Complete by:  As directed      Diet - low sodium heart healthy    Complete by:  As directed      Discharge instructions    Complete by:  As directed   JP care, may shower today, place ice on incision daily     Discharge wound care:    Complete by:  As directed   Standard JP care, empty BID     Increase activity slowly    Complete by:  As directed      Remove dressing in 24 hours    Complete by:  As directed              Medication List    TAKE these medications        HYDROcodone-acetaminophen 5-325 MG tablet  Commonly known as:  NORCO/VICODIN  Take 1-2 tablets by mouth every 4 (four) hours as needed for moderate pain.           Follow-up Information    [facility] weeks.       Sterling Bigiego Edinson Domeier, MD FACS

## 2016-03-11 NOTE — Care Management (Signed)
Patient to be discharged home today.  Patient is self pay patient. Spanish speaking.  Assessed with interpreter.  Patient has follow up scheduled.  Patient to only be discharged on pain medication.  Patient does not have PCP.  Patient provided Spanish applications for [facility].  Patient states that he is employeed part time , and denies financial or transportation issues.

## 2016-03-12 LAB — SURGICAL PATHOLOGY

## 2016-03-13 ENCOUNTER — Ambulatory Visit: Payer: Self-pay | Admitting: General Surgery

## 2016-03-20 ENCOUNTER — Encounter: Payer: Self-pay | Admitting: Surgery

## 2016-03-20 ENCOUNTER — Ambulatory Visit (INDEPENDENT_AMBULATORY_CARE_PROVIDER_SITE_OTHER): Payer: Self-pay | Admitting: Surgery

## 2016-03-20 VITALS — BP 127/85 | HR 86 | Temp 98.7°F | Wt 184.0 lb

## 2016-03-20 DIAGNOSIS — Z09 Encounter for follow-up examination after completed treatment for conditions other than malignant neoplasm: Secondary | ICD-10-CM

## 2016-03-20 NOTE — Patient Instructions (Signed)
Por favor llamenos si tiene alguna pregunta o problema.

## 2016-03-20 NOTE — Progress Notes (Signed)
S/p Giant incarcerated left inguinal hernia by Dr. Michela PitcherEly  Doing well, JP 15 cc daily Taking PO, pain controlled  PE NAD Abd: soft , NT incision healing well There is a left hydrocele, JP removed, staples removed. Difficult to tell if hernia has recurred, given the enormous size preoperatively he may have significant residual edema  A/P doing well overall , needs f/u 2 weeks to reassess hydrocele on the left side, hopefully it is just that and not a recurrent hernia No need for immediate surgical intervention Continue ice and lifting restrictions

## 2016-04-05 ENCOUNTER — Ambulatory Visit (INDEPENDENT_AMBULATORY_CARE_PROVIDER_SITE_OTHER): Payer: Self-pay | Admitting: General Surgery

## 2016-04-05 ENCOUNTER — Telehealth: Payer: Self-pay

## 2016-04-05 ENCOUNTER — Encounter: Payer: Self-pay | Admitting: General Surgery

## 2016-04-05 DIAGNOSIS — Z4889 Encounter for other specified surgical aftercare: Secondary | ICD-10-CM

## 2016-04-05 NOTE — Telephone Encounter (Signed)
LVM for patient to call office at this time to let him know of his ultrasound appointment.  Ultrasound appointment listed below: April 09, 2016 @ 3:15

## 2016-04-05 NOTE — Patient Instructions (Signed)
We will call you with your ultrasound appointment later today. Please call our office if you have any questions or concerns.

## 2016-04-05 NOTE — Progress Notes (Signed)
Outpatient Surgical Follow Up  04/05/2016  Robert Kaiser is an 35 y.o. male.   Chief Complaint  Patient presents with  . Routine Post Op    Incarcerated Left Inguinal Hernia- Dr.Ely-03/09/16    HPI: 35 year old male returns to clinic for follow-up after large incarcerated left inguinal hernia repair. He continues to have a hard bulge associated with his testicle on the left. The area also continues to be very large in size. Patient is Spanish-speaking only and entire visit was accomplished through the use of an interpreter. Patient reports he continues have some discomfort that area however the pain and pressure sensation had before surgery is gone. He denies any fevers, chills, nausea, vomiting, chest pain, shortness of breath, diarrhea, constipation.  History reviewed. No pertinent past medical history.  Past Surgical History  Procedure Laterality Date  . Inguinal hernia repair Left 03/09/2016    Procedure: HERNIA REPAIR INGUINAL ADULT;  Surgeon: Tiney Rougealph Ely III, MD;  Location: [facility] ORS;  Service: General;  Laterality: Left;  . Left inguinal hernia  03/09/16    Family History  Problem Relation Age of Onset  . Heart disease Father   . Stroke Father     Social History:  reports that he has never smoked. He does not have any smokeless tobacco history on file. He reports that he drinks alcohol. He reports that he does not use illicit drugs.  Allergies: No Known Allergies  Medications reviewed.    ROS A multipoint review of systems was completed. All pertinent positives and negatives are documented within the history of present illness remainder are negative.   BP 127/82 mmHg  Pulse 105  Temp(Src) 98 F (36.7 C) (Oral)  Ht 5\' 7"  (1.702 m)  Wt 83.462 kg (184 lb)  BMI 28.81 kg/m2  Physical Exam  Gen.: No acute distress Chest: Clear to auscultation Heart: Tachycardic Abdomen: Soft, nontender, nondistended. Well approximated left inguinal hernia incision site in the left groin  without evidence of erythema or infection. There is a pinpoint area of drainage on the most medial aspect consistent with where the knot from the suture was replaced. No palpable evidence of hernia recurrence. GU: Normal appearing male genitalia. Left hemiscrotum much larger than the right. There is a palpable, but high riding testicle on the left with a hard area associated with it.   No results found for this or any previous visit (from the past 48 hour(s)). No results found.  Assessment/Plan:  1. Aftercare following surgery 35 year old male status post left inguinal hernia repair for an incarcerated inguinal hernia. Continues to have a very large left hemiscrotum. On exam today difficult to ascertain at this just excess tissue versus a large volume hydrocele. An area of hardness exist on the left testicle which is unlikely to be related to the surgery that was performed. We will order an ultrasound to evaluate this as an outpatient. He'll need further follow-up for this and should this prove to be something concerning on his ultrasound images we would then place a referral to urology for further evaluation. He'll follow up in clinic after his ultrasound are obtained. - US Art/Ven Flow Abd Pelv Doppler; Future - US Scrotum; Future     Robert Frameharles Malakhai Beitler, MD [facility] General Surgeon  04/05/2016,12:59 PM

## 2016-04-09 ENCOUNTER — Ambulatory Visit
Admission: RE | Admit: 2016-04-09 | Discharge: 2016-04-09 | Disposition: A | Discharge status: Home / Self Care | Payer: MEDICAID | Source: Ambulatory Visit | Attending: General Surgery | Admitting: General Surgery

## 2016-04-09 DIAGNOSIS — Z4889 Encounter for other specified surgical aftercare: Secondary | ICD-10-CM | POA: Insufficient documentation

## 2016-04-09 DIAGNOSIS — N5089 Other specified disorders of the male genital organs: Secondary | ICD-10-CM | POA: Insufficient documentation

## 2016-04-10 ENCOUNTER — Telehealth: Payer: Self-pay

## 2016-04-10 NOTE — Telephone Encounter (Signed)
Patient called to find out the results to his ultrasound. Maud DeedShiela previously scheduled him an appointment on 04/15/2016 to discuss his results. Patient is aware.

## 2016-04-10 NOTE — Telephone Encounter (Signed)
LVM for patient to call office.  appointment made 04/15/16 @ 3:15 [place]

## 2016-04-15 ENCOUNTER — Encounter: Payer: Self-pay | Admitting: General Surgery

## 2016-04-15 ENCOUNTER — Ambulatory Visit (INDEPENDENT_AMBULATORY_CARE_PROVIDER_SITE_OTHER): Payer: Self-pay | Admitting: General Surgery

## 2016-04-15 VITALS — BP 130/81 | HR 90 | Temp 98.4°F | Wt 186.0 lb

## 2016-04-15 DIAGNOSIS — Z4889 Encounter for other specified surgical aftercare: Secondary | ICD-10-CM

## 2016-04-15 MED ORDER — HYDROCODONE-ACETAMINOPHEN 5-325 MG PO TABS: 1.0000 | ORAL_TABLET | Freq: Four times a day (QID) | ORAL | Status: AC | PRN

## 2016-04-15 NOTE — Patient Instructions (Addendum)
Por favor vaya a la Environmental consultantala de Emergencias si le causa dolor o que se le ponga duro.

## 2016-04-15 NOTE — Progress Notes (Signed)
Outpatient Surgical Follow Up  04/15/2016  Robert Kaiser is an 35 y.o. male.   Chief Complaint  Patient presents with  . Follow-up    Discuss Scrotum Ultrasound Result    HPI: 35 year old male returns to clinic to discuss follow-up from recent images. Patient reports that he's been able to work however the swelling to his left hemiscrotum has continued. He does have intermittent pains the area that he's been able to work through it. He denies any fevers, chills, nausea, vomiting, chest pain, shortness of breath, diarrhea, constipation, any urinary symptoms. He doesn't think the area has changed any since his last visit. He continues to have a palpable, tender, hard area at the top of the scrotum.  History reviewed. No pertinent past medical history.  Past Surgical History  Procedure Laterality Date  . Inguinal hernia repair Left 03/09/2016    Procedure: HERNIA REPAIR INGUINAL ADULT;  Surgeon: Tiney Rougealph Ely III, MD;  Location: [facility] ORS;  Service: General;  Laterality: Left;  . Left inguinal hernia  03/09/16    Family History  Problem Relation Age of Onset  . Heart disease Father   . Stroke Father     Social History:  reports that he has never smoked. He does not have any smokeless tobacco history on file. He reports that he drinks alcohol. He reports that he does not use illicit drugs.  Allergies: No Known Allergies  Medications reviewed.    ROS  A multipoint review of systems was completed. All pertinent positives and negatives were documented within the history of present illness and remainder are negative.  BP 130/81 mmHg  Pulse 90  Temp(Src) 98.4 F (36.9 C) (Oral)  Wt 84.369 kg (186 lb)  Physical Exam  Gen.: No acute distress Chest: Clear to auscultation Heart: Regular rate and rhythm Abdomen: Soft, nontender, nondistended GU: Bilateral testicles are palpable within the scrotum. The left hemiscrotum is much larger than the right causing displacement to the right.  There is a palpable, tender, hard area at the area close to the external inguinal ring. This is able to be reduced into the abdomen. The entire area is able to be compressed into the abdomen.   No results found for this or any previous visit (from the past 48 hour(s)). No results found.  Assessment/Plan:  1. Aftercare following surgery 35 year old male status post left inguinal hernia repair for incarcerated hernia. Appears to have prolapsed his hernia repair mesh into his left hemiscrotum allowing large volumes swelling and possible hernia recurrence. Patient is able to tolerate current situation and has been working through. He desires to continue to be working. Discussed that elective repair at this time would be at higher risk due to his recent surgery. We will attempt to get further out from his previous surgery before attempting to operate again. This is likely a hernia recurrence versus mesh migration. He voiced understanding and will follow-up in clinic next month to discuss operative intervention after that.     [name], MD FACS General Surgeon  04/15/2016,4:50 PM

## 2016-05-08 ENCOUNTER — Ambulatory Visit (INDEPENDENT_AMBULATORY_CARE_PROVIDER_SITE_OTHER): Payer: Self-pay | Admitting: General Surgery

## 2016-05-08 ENCOUNTER — Encounter: Payer: Self-pay | Admitting: General Surgery

## 2016-05-08 DIAGNOSIS — R1032 Left lower quadrant pain: Secondary | ICD-10-CM

## 2016-05-08 DIAGNOSIS — R1030 Lower abdominal pain, unspecified: Secondary | ICD-10-CM

## 2016-05-08 NOTE — Patient Instructions (Signed)
Nosotros lo vamos a mandar a [facility] para que lo [place]. Ellos son especialistasen hernias. Si no recibe llamada de ellos en la proxima semana, por favor llameme para ver es lo que esta pasando con su cita.

## 2016-05-08 NOTE — Progress Notes (Signed)
Outpatient Surgical Follow Up  05/08/2016  Robert Kaiser is an 35 y.o. male.   Chief Complaint  Patient presents with  . Routine Post Op    Left Inguinal Hernia 6/10/217 Dr. Michela PitcherEly    HPI: 35 year old male returns to clinic for evaluation of left groin pain. Patient is now 2 months status post emergent repair of a very large incarcerated left inguinal hernia. Patient reports since he was last seen that has not changed in size. It does not fluctuate in size but has remained enlarged. His primary complaint is of discomfort to his left leg and scrotum. This discomfort is worsened after days of standing. 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 His only complaint is groin pain. He is very interesting doing something to improve his pain.  No past medical history on file.  Past Surgical History:  Procedure Laterality Date  . INGUINAL HERNIA REPAIR Left 03/09/2016   Procedure: HERNIA REPAIR INGUINAL ADULT;  Surgeon: Tiney Rougealph Ely III, MD;  Location: [facility] ORS;  Service: General;  Laterality: Left;  . left Inguinal hernia  03/09/16    Family History  Problem Relation Age of Onset  . Heart disease Father   . Stroke Father     Social History:  reports that he has never smoked. He has never used smokeless tobacco. He reports that he drinks alcohol. He reports that he does not use drugs.  Allergies: No Known Allergies  Medications reviewed.    ROS A multipoint review of systems was completed. All pertinent positives and negatives are documented within the history of present illness remainder negative.   BP 133/87 (BP Location: Left Arm, Patient Position: Sitting, Cuff Size: Normal)   Pulse 65   Temp 98.7 F (37.1 C) (Oral)   Wt 80.7 kg (178 lb)   BMI 27.88 kg/m   Physical Exam Gen.: No acute distress Chest: Clear to auscultation Heart: Regular rhythm Abdomen: Soft, nondistended,  nontender. GU: Left hemiscrotum remains enlarged. Palpable hardness at the site of prior hernia repair. However, hernia repair feels intact and with Valsalva and crunches there is no propulsion or obvious hernia recurrence. Area is not tender to palpation.    No results found for this or any previous visit (from the past 48 hour(s)). No results found.  Assessment/Plan:  1. Left groin pain 35 year old male with persistent left groin pain 2 months status post incarcerated left inguinal hernia repair. Preoperatively the patient had a massive left inguinal hernia that he did not address for greater than 10 years. Repair appears to be intact. Given the chronicity of the hernia that was repaired it is not unusual furthered be a size discrepancy postop. However, given the pain he is having and the likely extensive surgery that could be required for its repair we recommended transfer to a hernia center. [facility] evaluation and possible treatment there. All questions answered to patient's satisfaction to follow up on an as-needed basis.     Robert Frameharles Eldora Napp, MD FACS General Surgeon  05/08/2016,9:46 AM

## 2016-05-10 ENCOUNTER — Telehealth: Payer: Self-pay

## 2016-05-10 NOTE — Telephone Encounter (Signed)
Error

## 2016-05-10 NOTE — Telephone Encounter (Signed)
Called patient and left him a voicemail to return my call.   appointment is [facility]16 [facility]. They will mail him new patient information. Patient can call [phone] to reschedule if needed.

## 2016-05-13 NOTE — Telephone Encounter (Signed)
Patient called this afternoon and his [facility] and phone number was given to patient's co-worker by Ami A. He had no further questions.

## 2018-02-15 IMAGING — CT CT ABD-PELV W/ CM
2 of 4 series · 14 of 46 positions shown, 16 images · IV contrast (iopamidol)
Comparison: None.

CLINICAL DATA: Left scrotal pain. Known hernia. Difficulty walking.

EXAM:
CT ABDOMEN AND PELVIS WITH CONTRAST
TECHNIQUE: Multidetector CT imaging of the abdomen and pelvis was performed
using the standard protocol following bolus administration of
intravenous contrast.
CONTRAST:  100mL MRCPIG-3CC IOPAMIDOL (MRCPIG-3CC) INJECTION 61%

[Series 2: routine abd pel with · axial · 0.65mm/px · z∈[-1138,-582]mm · 11 of 133 slices shown, 13 images]
[im 11/133  soft-tissue]
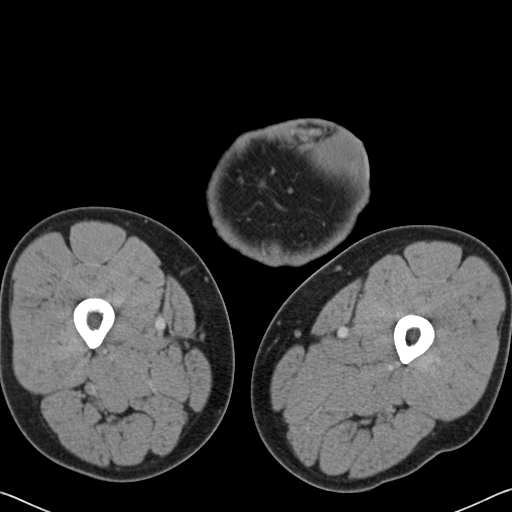
[im 11/133  bone]
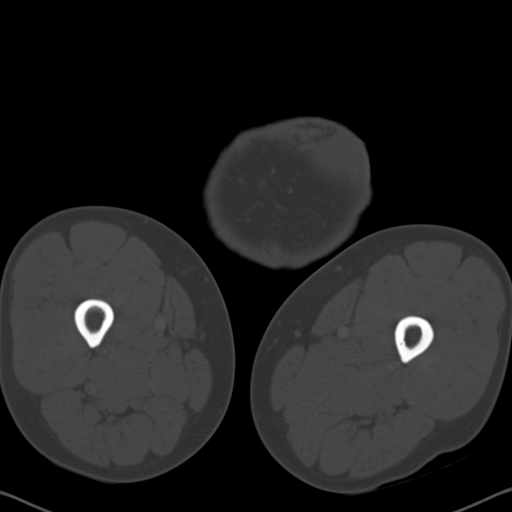
[im 22/133  soft-tissue]
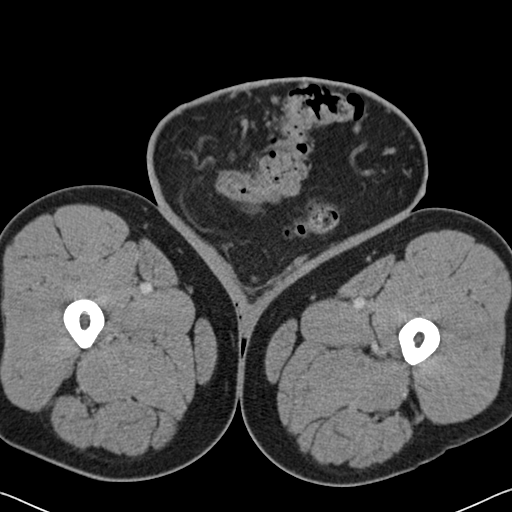
[im 32/133  soft-tissue]
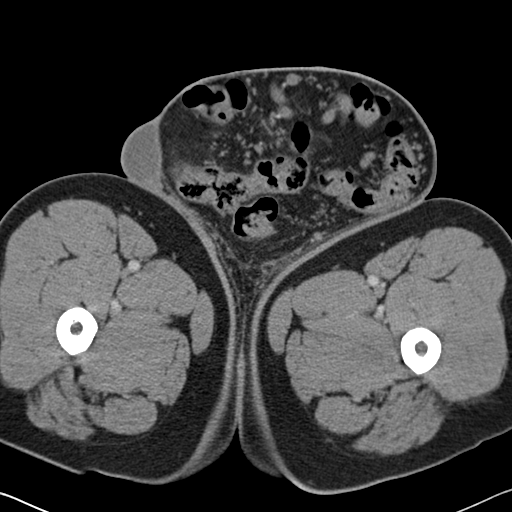
[im 43/133  soft-tissue]
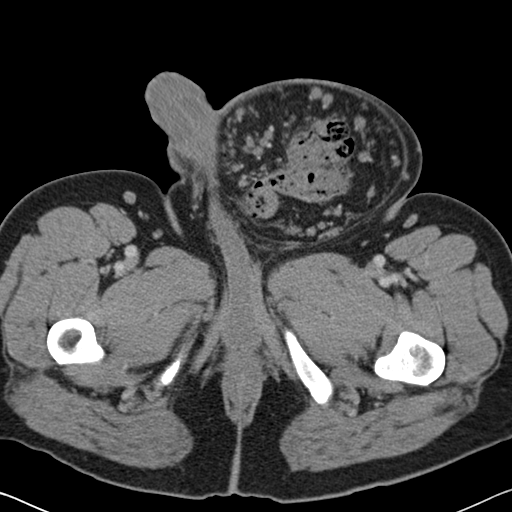
[im 53/133  soft-tissue]
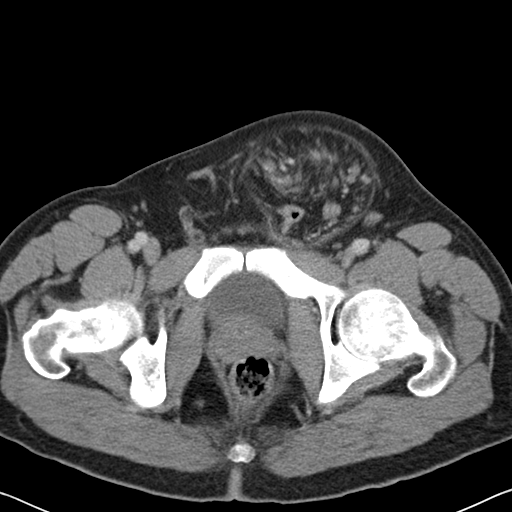
[im 69/133  soft-tissue]
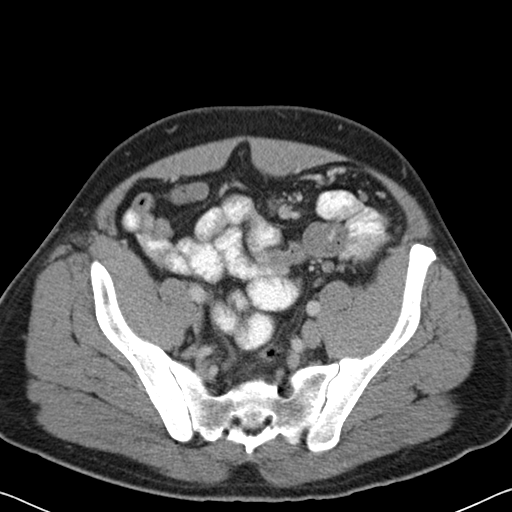
[im 80/133  soft-tissue]
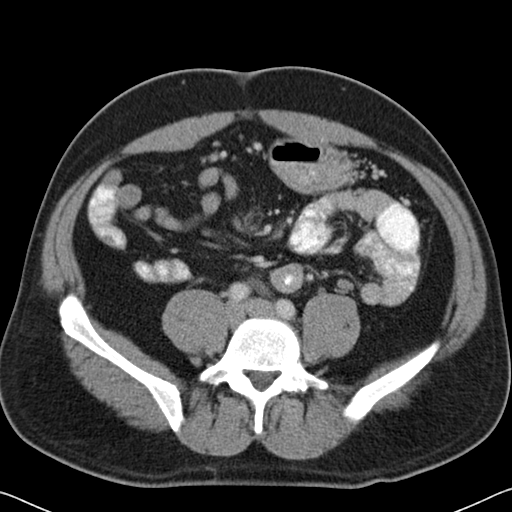
[im 90/133  soft-tissue]
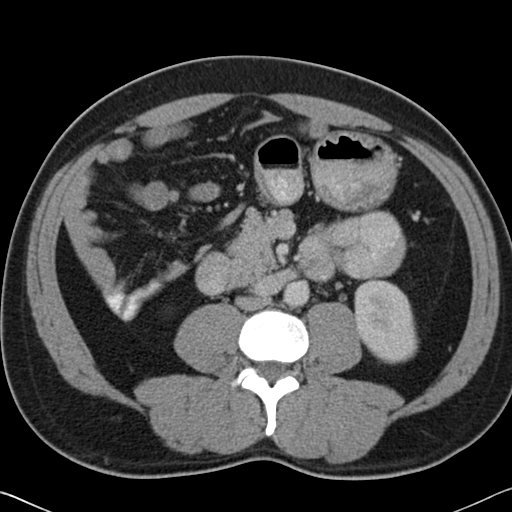
[im 101/133  soft-tissue]
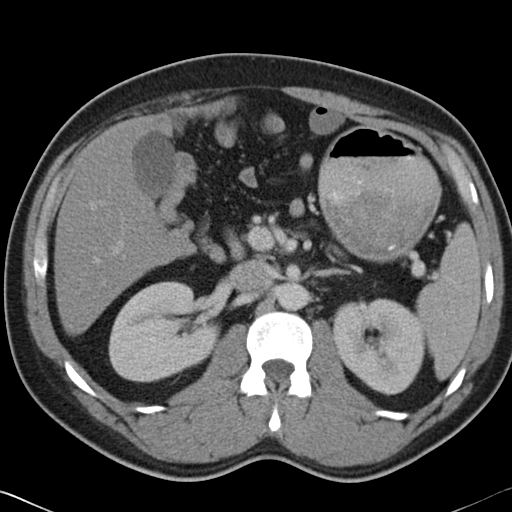
[im 101/133  bone]
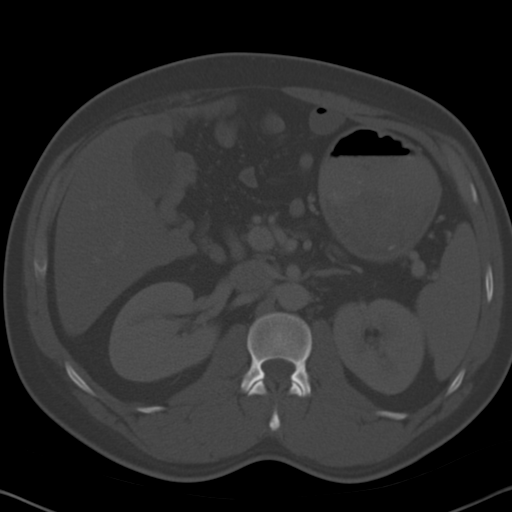
[im 111/133  soft-tissue]
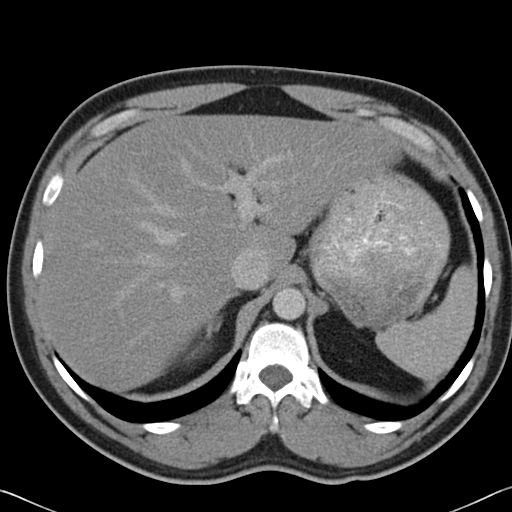
[im 122/133  soft-tissue]
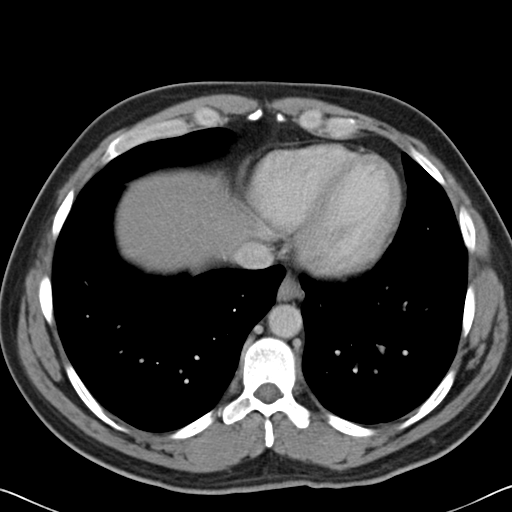

[Series 5: cor routine abd pel with · coronal · 0.72mm/px · 3 of 140 slices shown]
[im 47/140  soft-tissue]
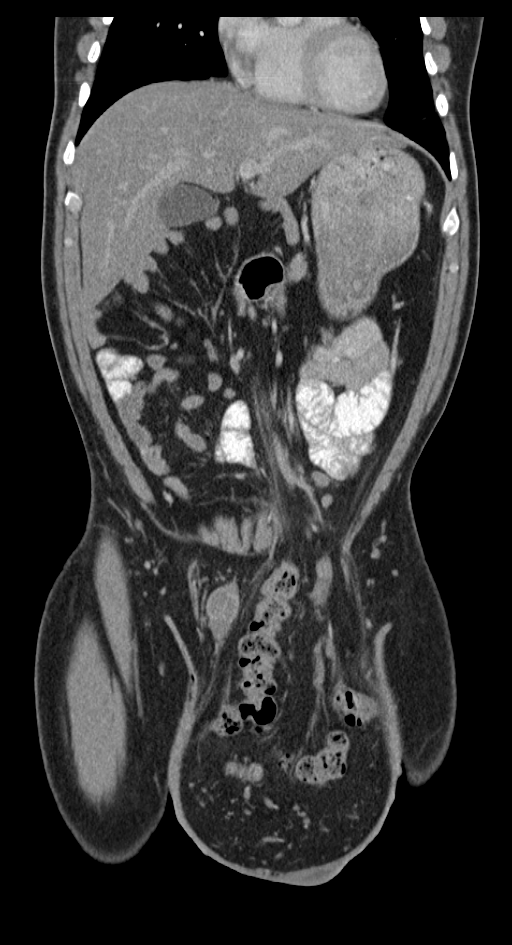
[im 62/140  soft-tissue]
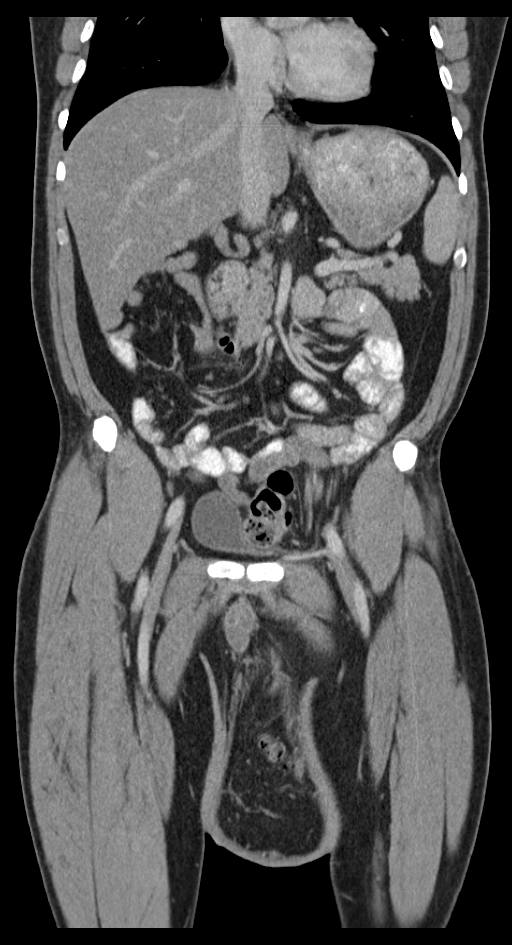
[im 78/140  soft-tissue]
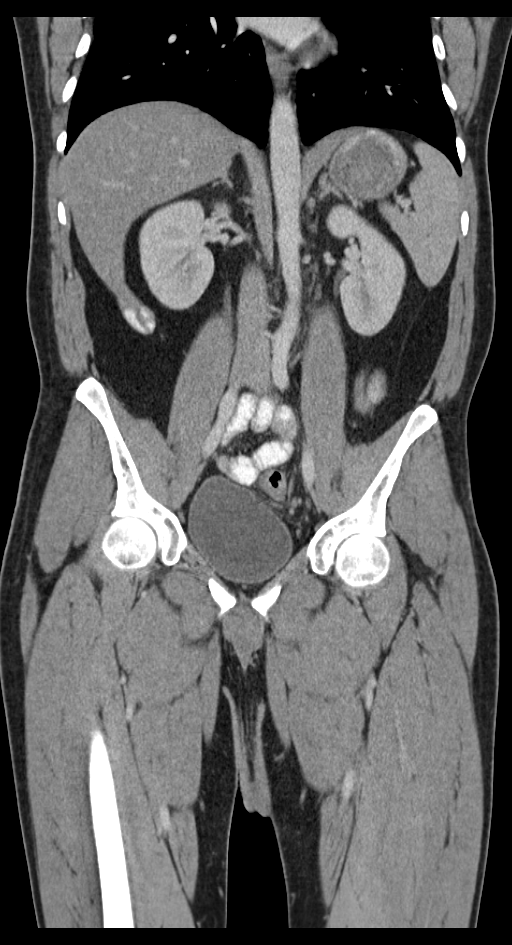

[14 of 46 positions shown; findings below may reference images not displayed]

FINDINGS: Lower chest: The lung bases are clear of acute process. No pleural
effusion or pulmonary lesions. The heart is normal in size. No
pericardial effusion. The distal esophagus and aorta are
unremarkable.

Hepatobiliary: No focal hepatic lesions or intrahepatic biliary
dilatation. The gallbladder is normal. No common bile duct
dilatation.

Pancreas: No mass, inflammation or ductal dilatation.

Spleen: Normal size.  No focal lesions.

Adrenals/Urinary Tract: The adrenal glands and kidneys are normal.
No ureteral or bladder calculi. The bladder is normal.

Stomach/Bowel: The stomach, duodenum and small bowel are
unremarkable. No inflammatory changes, mass lesions or obstructive
findings.

Patient has a large left inguinal hernia which contains the entire
colon and terminal ileum. No obstructive findings or evidence of
incarceration.

Vascular/Lymphatic: The aorta and branch vessels are normal. The
major venous structures are patent. No mesenteric or retroperitoneal
mass or adenopathy.

Other: No ascites or abdominal wall hernia. No pelvic mass or
adenopathy. The prostate gland and seminal vesicles are
unremarkable.

Musculoskeletal: No significant bony findings.
IMPRESSION: 1. Large left inguinal hernia containing the entire colon and
terminal ileum. No findings for obstruction or incarceration.
2. No acute abdominal/pelvic findings, mass lesions or adenopathy.
